# Patient Record
Sex: Male | Born: 1945 | Race: White | Hispanic: No | Marital: Single
Health system: Southern US, Community
[De-identification: ages and names within clinical notes are randomized; demographics above are authoritative.]

---

## 2002-07-26 ENCOUNTER — Ambulatory Visit (HOSPITAL_BASED_OUTPATIENT_CLINIC_OR_DEPARTMENT_OTHER): Admission: RE | Admit: 2002-07-26 | Discharge: 2002-07-26 | Payer: Self-pay | Admitting: Family Medicine

## 2006-02-18 ENCOUNTER — Encounter: Admission: RE | Admit: 2006-02-18 | Discharge: 2006-02-18 | Payer: Self-pay | Admitting: Family Medicine

## 2012-08-03 ENCOUNTER — Ambulatory Visit
Admission: RE | Admit: 2012-08-03 | Discharge: 2012-08-03 | Disposition: A | Discharge status: Home / Self Care | Payer: Medicare Other | Source: Ambulatory Visit | Attending: Family Medicine | Admitting: Family Medicine

## 2012-08-03 ENCOUNTER — Other Ambulatory Visit: Payer: Self-pay | Admitting: Family Medicine

## 2012-08-03 DIAGNOSIS — R52 Pain, unspecified: Secondary | ICD-10-CM

## 2012-08-03 DIAGNOSIS — W19XXXA Unspecified fall, initial encounter: Secondary | ICD-10-CM

## 2013-09-18 IMAGING — CR DG WRIST COMPLETE 3+V*L*
4 series · 4 of 4 positions shown · non-contrast
Comparison: None.

CLINICAL DATA: Recent fall, pain

LEFT WRIST - COMPLETE 3+ VIEW

[view not recorded (1 of 4)]
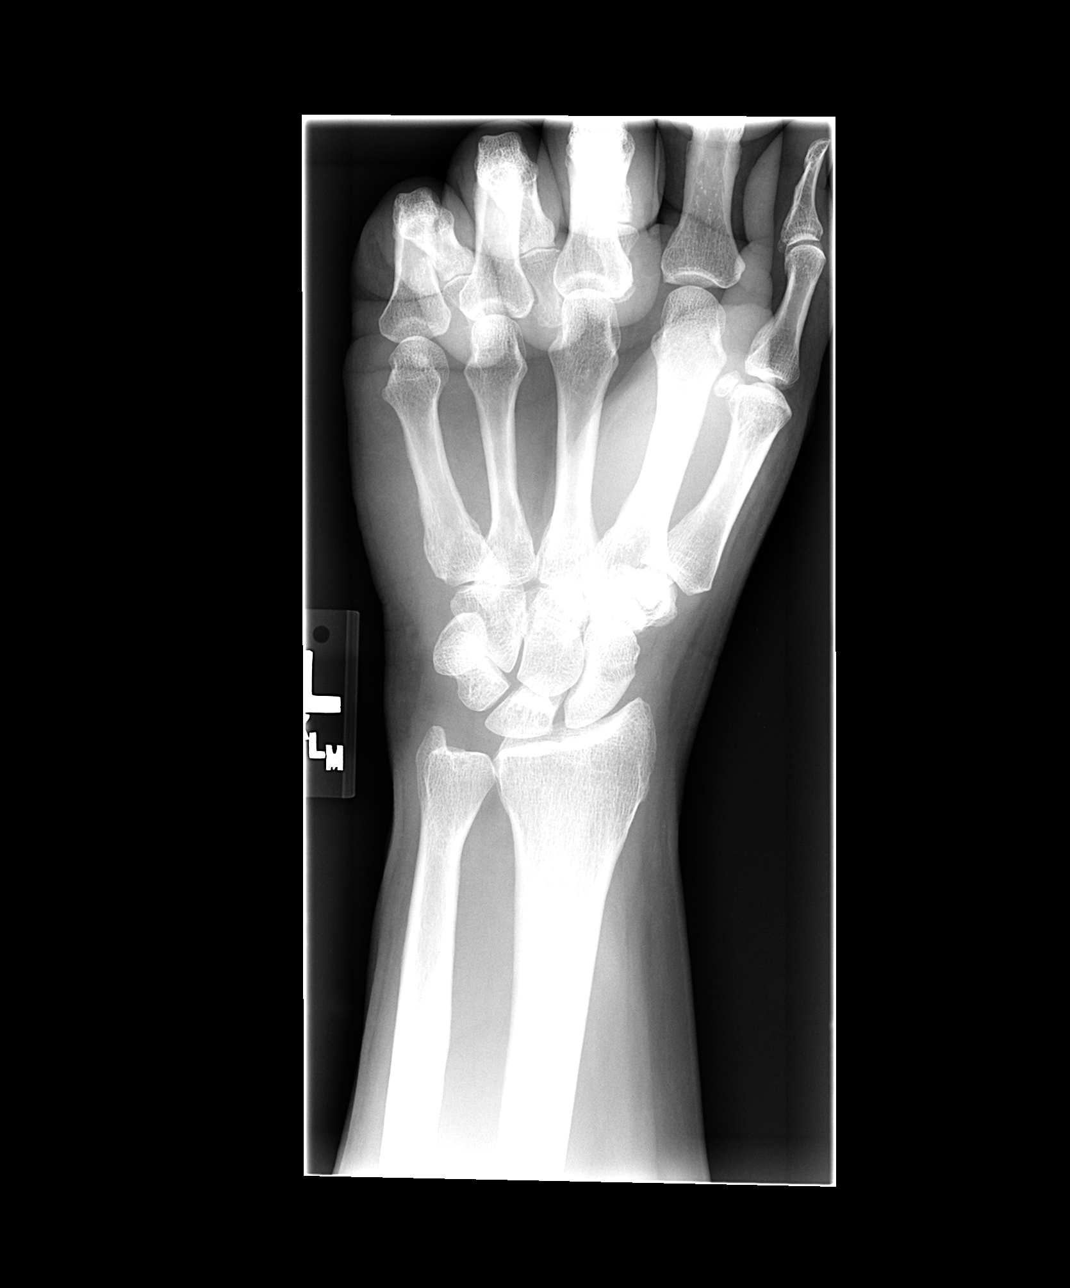

[view not recorded (2 of 4)]
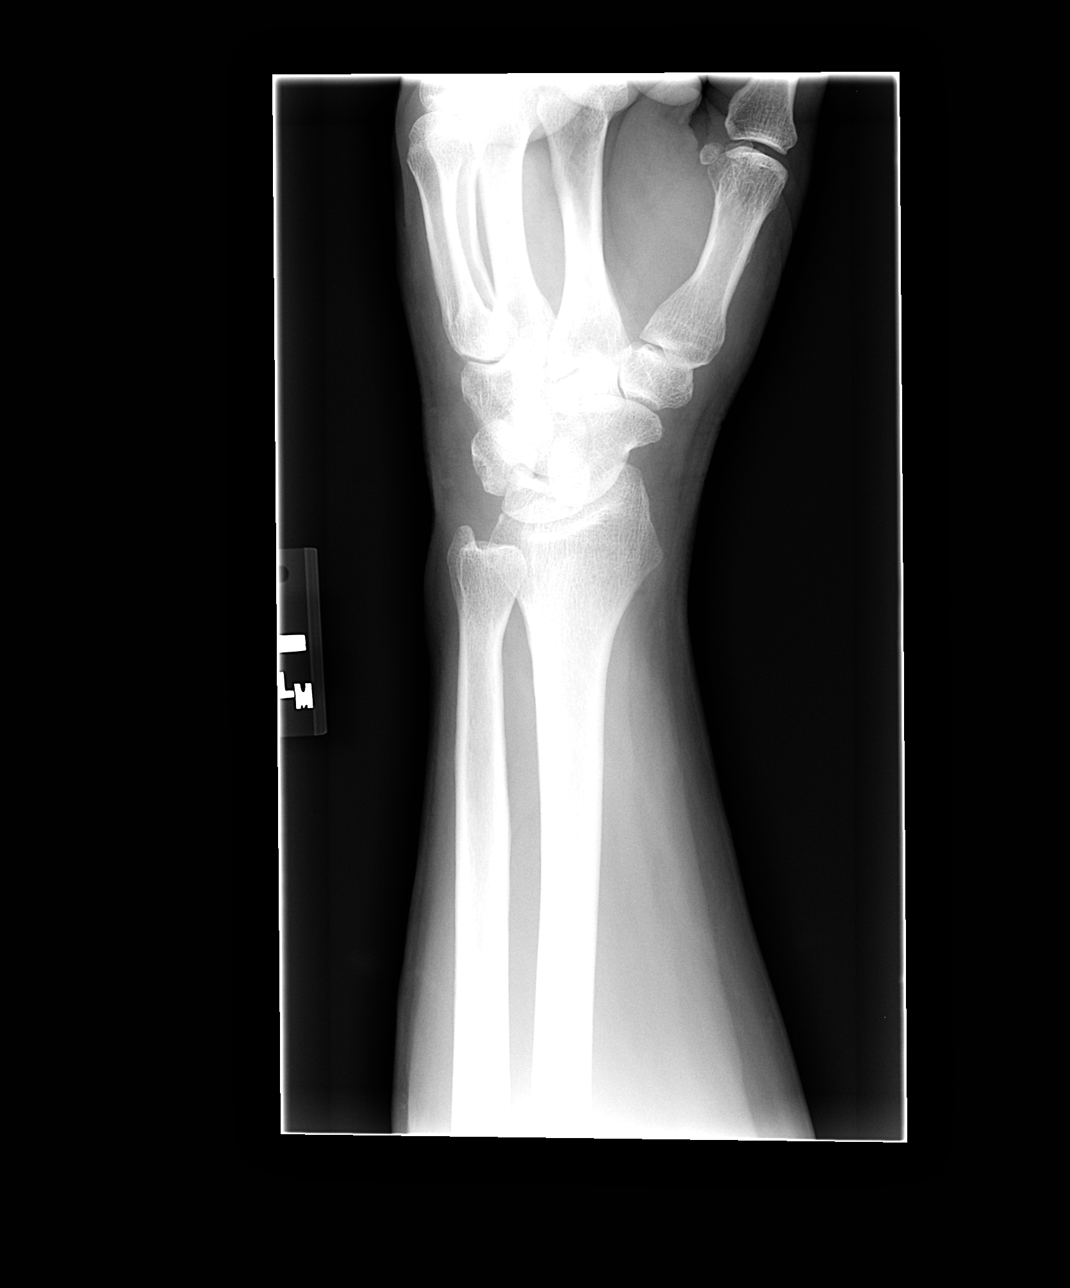

[view not recorded (3 of 4)]
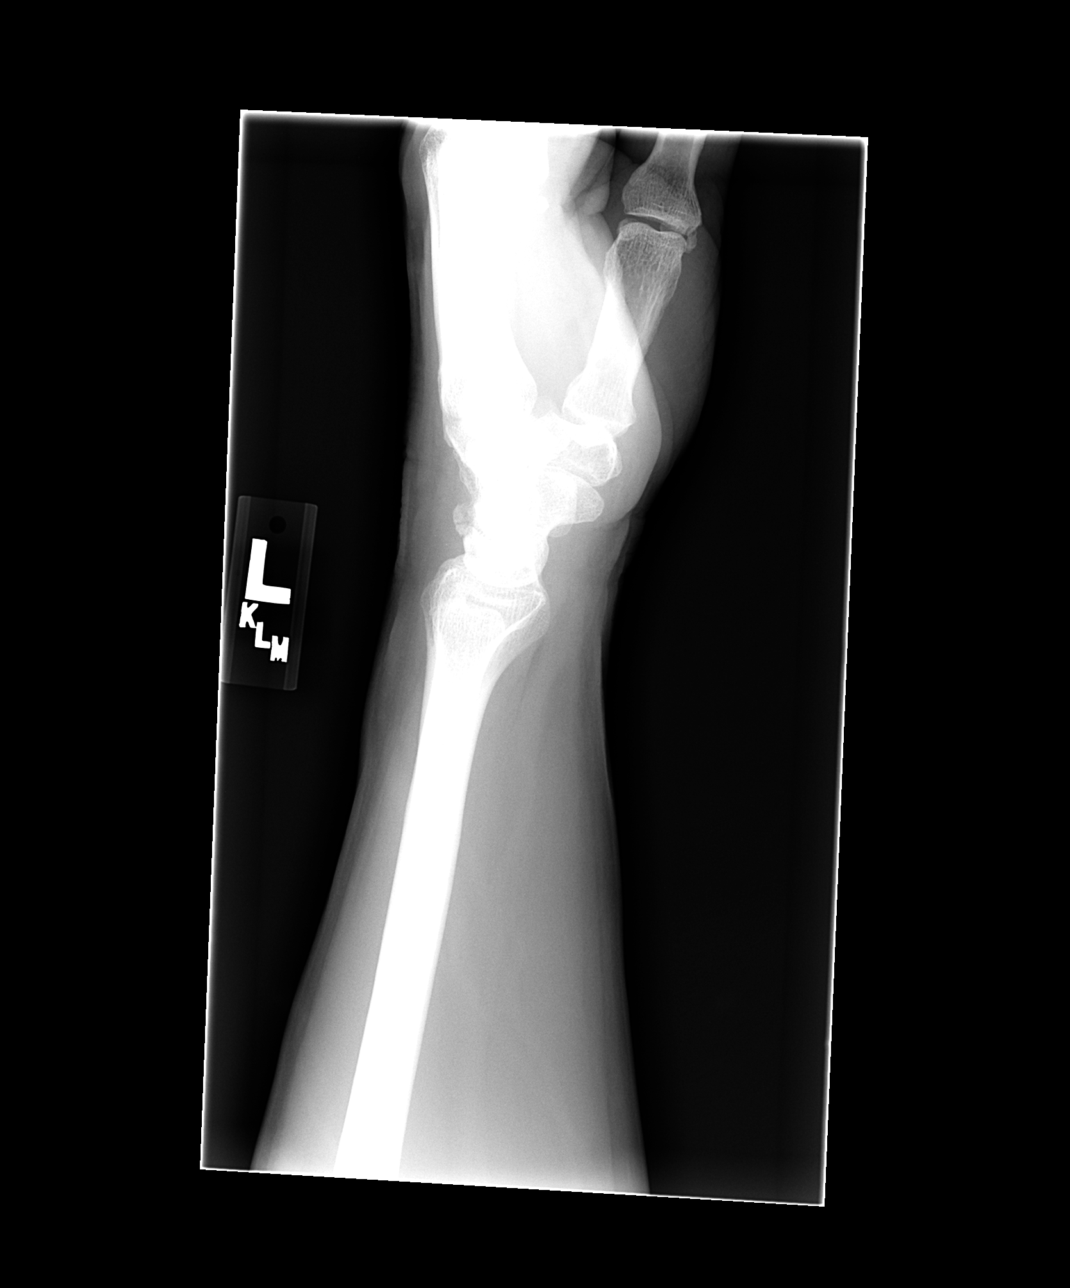

[view not recorded (4 of 4)]
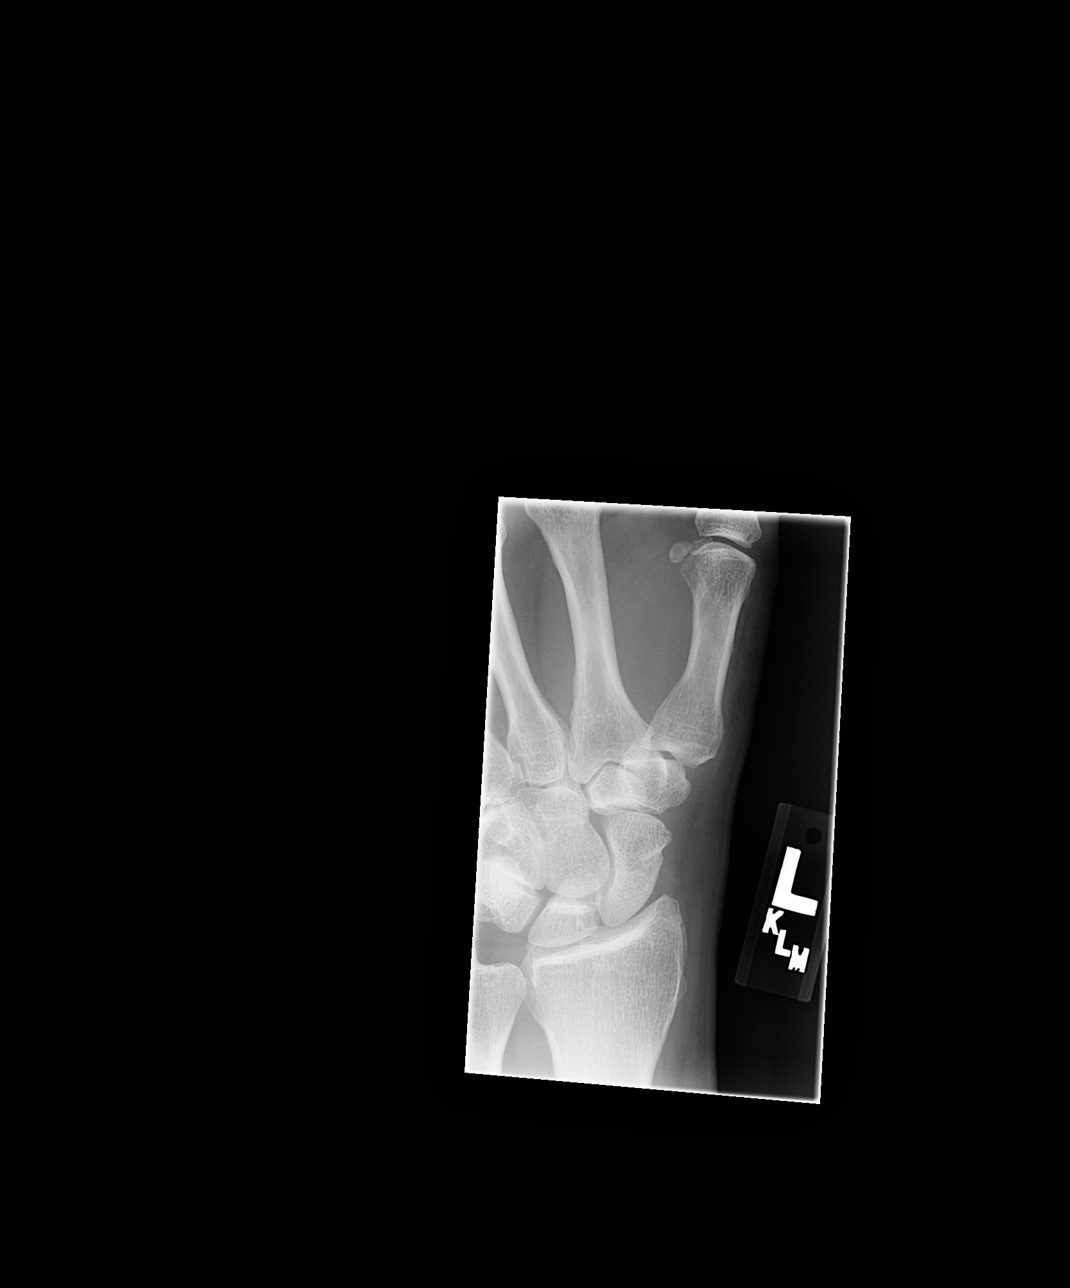

[4 of 4 positions shown; findings below may reference images not displayed]

FINDINGS: The radiocarpal joint space appears normal.  The carpal
bones are in normal position.  However, on the lateral view there
is irregularity of the cortical margin of a carpal bones dorsally
which may represent a triquetral fracture.  The navicular appears
intact.
IMPRESSION: Suspect fracture of the triquetral bone on the lateral view.

## 2019-08-25 ENCOUNTER — Other Ambulatory Visit: Payer: Self-pay

## 2019-08-25 DIAGNOSIS — Z20822 Contact with and (suspected) exposure to covid-19: Secondary | ICD-10-CM

## 2019-08-26 LAB — NOVEL CORONAVIRUS, NAA: SARS-CoV-2, NAA: NOT DETECTED

## 2019-12-12 ENCOUNTER — Ambulatory Visit: Payer: Medicare Other | Attending: Internal Medicine

## 2019-12-12 DIAGNOSIS — Z23 Encounter for immunization: Secondary | ICD-10-CM | POA: Insufficient documentation

## 2019-12-12 NOTE — Progress Notes (Signed)
   Covid-[facility] Vaccination [facility]  Name:  Wayne Gilbert    MRN: [id number] DOB: April 09, 1946  12/12/2019  [name] was observed post Covid-19 immunization for 15 minutes without incidence. He was provided with Vaccine Information Sheet and instruction to access the V-Safe system.   [name] was instructed to call 911 with any severe reactions post vaccine: Marland Kitchen Difficulty breathing  . Swelling of your face and throat  . A fast heartbeat  . A bad rash all over your body  . Dizziness and weakness    Immunizations Administered    Name Date Dose VIS Date Route   Pfizer COVID-19 Vaccine 12/12/2019  6:13 PM 0.3 mL 10/15/2019 Intramuscular   Manufacturer: [employer], [employer]   Lot: [id number]   NDC: [address]-7

## 2019-12-30 ENCOUNTER — Ambulatory Visit: Payer: Self-pay

## 2020-01-08 ENCOUNTER — Ambulatory Visit: Payer: Medicare Other | Attending: Internal Medicine

## 2020-01-08 DIAGNOSIS — Z23 Encounter for immunization: Secondary | ICD-10-CM | POA: Insufficient documentation

## 2020-01-08 NOTE — Progress Notes (Signed)
   Covid-19 Vaccination Clinic  Name:  Wayne Gilbert    MRN: [id number] DOB: [birth year]  01/08/2020  Wayne Gilbert was observed post Covid-19 immunization for 15 minutes without incident. He was provided with Vaccine Information Sheet and instruction to access the V-Safe system.   Wayne Gilbert was instructed to call 911 with any severe reactions post vaccine: Marland Kitchen Difficulty breathing  . Swelling of face and throat  . A fast heartbeat  . A bad rash all over body  . Dizziness and weakness   Immunizations Administered    Name Date Dose VIS Date Route   Pfizer COVID-19 Vaccine 01/08/2020  3:00 PM 0.3 mL 10/15/2019 Intramuscular   Manufacturer: [employer], [employer]   Lot: [id number]   NDC: [address]-4

## 2020-09-16 ENCOUNTER — Ambulatory Visit: Payer: Medicare Other | Attending: Internal Medicine

## 2020-09-16 DIAGNOSIS — Z23 Encounter for immunization: Secondary | ICD-10-CM

## 2020-09-16 NOTE — Progress Notes (Signed)
   Covid-19 Vaccination Clinic  Name:  DARRIEN BELTER    MRN: [id number] DOB: 1946-10-31  09/16/2020  Mr. Esses was observed post Covid-19 immunization for 15 minutes without incident. He was provided with Vaccine Information Sheet and instruction to access the V-Safe system.   Mr. Navarrete was instructed to call 911 with any severe reactions post vaccine: Marland Kitchen Difficulty breathing  . Swelling of face and throat  . A fast heartbeat  . A bad rash all over body  . Dizziness and weakness
